# Patient Record
Sex: Female | Born: 2000 | Hispanic: Yes | Marital: Single
Health system: Southern US, Community
[De-identification: ages and names within clinical notes are randomized; demographics above are authoritative.]

---
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
## 2021-05-31 ENCOUNTER — Emergency Department (HOSPITAL_COMMUNITY)
Admission: EM | Admit: 2021-05-31 | Discharge: 2021-05-31 | Disposition: A | Discharge status: Home / Self Care | Payer: BC Managed Care – PPO | Attending: Emergency Medicine | Admitting: Emergency Medicine

## 2021-05-31 ENCOUNTER — Emergency Department (HOSPITAL_COMMUNITY): Payer: BC Managed Care – PPO

## 2021-05-31 ENCOUNTER — Encounter (HOSPITAL_COMMUNITY): Payer: Self-pay | Admitting: Emergency Medicine

## 2021-05-31 DIAGNOSIS — M79645 Pain in left finger(s): Secondary | ICD-10-CM | POA: Diagnosis not present

## 2021-05-31 DIAGNOSIS — Z23 Encounter for immunization: Secondary | ICD-10-CM | POA: Diagnosis not present

## 2021-05-31 DIAGNOSIS — W231XXA Caught, crushed, jammed, or pinched between stationary objects, initial encounter: Secondary | ICD-10-CM | POA: Insufficient documentation

## 2021-05-31 DIAGNOSIS — S61305A Unspecified open wound of left ring finger with damage to nail, initial encounter: Secondary | ICD-10-CM | POA: Insufficient documentation

## 2021-05-31 MED ORDER — TETANUS-DIPHTH-ACELL PERTUSSIS 5-2.5-18.5 LF-MCG/0.5 IM SUSY
0.5000 mL | PREFILLED_SYRINGE | Freq: Once | INTRAMUSCULAR | Status: AC
  Administered 2021-05-31: 0.5 mL via INTRAMUSCULAR
  Filled 2021-05-31: qty 0.5

## 2021-05-31 MED ORDER — ACETAMINOPHEN 325 MG PO TABS
650.0000 mg | ORAL_TABLET | Freq: Once | ORAL | Status: AC
  Administered 2021-05-31: 650 mg via ORAL
  Filled 2021-05-31: qty 2

## 2021-05-31 MED ORDER — OXYCODONE-ACETAMINOPHEN 5-325 MG PO TABS
1.0000 | ORAL_TABLET | Freq: Once | ORAL | Status: AC
  Administered 2021-05-31: 1 via ORAL
  Filled 2021-05-31: qty 1

## 2021-05-31 MED ORDER — CEPHALEXIN 500 MG PO CAPS: 500.0000 mg | ORAL_CAPSULE | Freq: Three times a day (TID) | ORAL | 0 refills | Status: AC

## 2021-05-31 NOTE — Discharge Instructions (Addendum)
Call [facility] on Monday to make an appointment with a hand surgeon.  Please use Tylenol or ibuprofen for pain.  You may use 600 mg ibuprofen every 6 hours or 1000 mg of Tylenol every 6 hours.  You may choose to alternate between the 2.  This would be most effective.  Not to exceed 4 g of Tylenol within 24 hours.  Not to exceed 3200 mg ibuprofen 24 hours.

## 2021-05-31 NOTE — ED Triage Notes (Signed)
Pt closed her L ring and middle fingers in a car door. L ring finger nail is almost completely detached. Reports limited ROM. Icepack applied, jewelry removed.

## 2021-05-31 NOTE — ED Provider Notes (Signed)
[facility]-EMERGENCY DEPT Provider Note   CSN: [id number] Arrival date & time: 05/31/21  0349     History Chief Complaint  Patient presents with   Finger Injury    Kelly Cannon is a 20 y.o. female.  HPI Patient is a 20 year old female presented to the ER today with complaints of left ring finger pain after her acrylic nail extension was caught in a car door and pulled the nail partially off      History reviewed. No pertinent past medical history.  There are no problems to display for this patient.   History reviewed. No pertinent surgical history.   OB History   No obstetric history on file.     History reviewed. No pertinent family history.     Home Medications Prior to Admission medications   Medication Sig Start Date End Date Taking? Authorizing Provider  cephALEXin (KEFLEX) 500 MG capsule Take 1 capsule (500 mg total) by mouth 3 (three) times daily for 5 days. 05/31/21 06/05/21 Yes Gailen Shelter, PA    Allergies    Patient has no known allergies.  Review of Systems   Review of Systems  Constitutional:  Negative for fever.  HENT:  Negative for congestion.   Respiratory:  Negative for shortness of breath.   Cardiovascular:  Negative for chest pain.  Gastrointestinal:  Negative for abdominal distention.  Skin:        Nail avulsion  Neurological:  Negative for dizziness and headaches.   Physical Exam Updated Vital Signs BP (!) 146/90 (BP Location: Right Arm)   Pulse 79   Temp 99.2 F (37.3 C) (Oral)   Resp 18   Ht 5\' 6"  (1.676 m)   Wt 65.8 kg   SpO2 96%   BMI 23.40 kg/m   Physical Exam Vitals and nursing note reviewed.  Constitutional:      General: She is not in acute distress.    Appearance: Normal appearance. She is not ill-appearing.  HENT:     Head: Normocephalic and atraumatic.  Eyes:     General: No scleral icterus.       Right eye: No discharge.        Left eye: No discharge.      Conjunctiva/sclera: Conjunctivae normal.  Pulmonary:     Effort: Pulmonary effort is normal.     Breath sounds: No stridor.  Skin:    General: Skin is warm and dry.     Comments: Cap refill intact in all fingers of left hand.  Left ring finger with avulsed nail it is still connected at the proximal nail plate however has hinged upward with acrylic nail still attached.  Fingertip has good cap refill and sensation  Neurological:     Mental Status: She is alert and oriented to person, place, and time. Mental status is at baseline.    ED Results / Procedures / Treatments   Labs (all labs ordered are listed, but only abnormal results are displayed) Labs Reviewed - No data to display  EKG None  Radiology DG Finger Middle Left  Result Date: 05/31/2021 CLINICAL DATA:  Trauma: Nail injury due to cardial or EXAM: LEFT MIDDLE FINGER 2+V COMPARISON:  None. FINDINGS: There is no evidence of fracture or dislocation. No opaque foreign body. IMPRESSION: Negative for fracture. Electronically Signed   By: 13/06/2021 M.D.   On: 05/31/2021 05:28   DG Finger Ring Left  Result Date: 05/31/2021 CLINICAL DATA:  Ring finger injury. EXAM: LEFT RING  FINGER 2+V COMPARISON:  None. FINDINGS: Up lifted nail. No underlying fracture, opaque foreign body, or subluxation. IMPRESSION: Nail injury without fracture or opaque foreign body. Electronically Signed   By: Tiburcio Pea M.D.   On: 05/31/2021 05:29    Procedures Procedures  Fingernail was shortened with trauma shears tape was used to fix the nail in place and the finger was splinted to provide protection.   Medications Ordered in ED Medications  oxyCODONE-acetaminophen (PERCOCET/ROXICET) 5-325 MG per tablet 1 tablet (1 tablet Oral Given 05/31/21 0617)  acetaminophen (TYLENOL) tablet 650 mg (650 mg Oral Given 05/31/21 0617)  Tdap (BOOSTRIX) injection 0.5 mL (0.5 mLs Intramuscular Given 05/31/21 6387)    ED Course  I have reviewed the triage  vital signs and the nursing notes.  Pertinent labs & imaging results that were available during my care of the patient were reviewed by me and considered in my medical decision making (see chart for details).    MDM Rules/Calculators/A&P                          Patient with nail avulsion this was fixed back in place with tape and Coban and finger splint was applied she will follow-up with hand surgery. Updated on Tdap and given Keflex 3 times daily and discharged home.  Return precautions given.  Otherwise she will follow-up with hand surgery.  Briefly discussed with attending physician. Pt agreable to plan.  I did offer digital block prior to procedure however patient would prefer not to do this.  She tolerated procedure well.  Final Clinical Impression(s) / ED Diagnoses Final diagnoses:  Avulsion of nail of left ring finger    Rx / DC Orders ED Discharge Orders          Ordered    cephALEXin (KEFLEX) 500 MG capsule  3 times daily        05/31/21 0635             Gailen Shelter, PA 05/31/21 0750    Palumbo, April, MD 05/31/21 2327

## 2021-06-02 ENCOUNTER — Emergency Department (HOSPITAL_COMMUNITY)
Admission: EM | Admit: 2021-06-02 | Discharge: 2021-06-02 | Disposition: A | Discharge status: Home / Self Care | Payer: BC Managed Care – PPO | Attending: Emergency Medicine | Admitting: Emergency Medicine

## 2021-06-02 ENCOUNTER — Other Ambulatory Visit: Payer: Self-pay

## 2021-06-02 ENCOUNTER — Encounter (HOSPITAL_COMMUNITY): Payer: Self-pay

## 2021-06-02 DIAGNOSIS — S6992XD Unspecified injury of left wrist, hand and finger(s), subsequent encounter: Secondary | ICD-10-CM

## 2021-06-02 DIAGNOSIS — W230XXA Caught, crushed, jammed, or pinched between moving objects, initial encounter: Secondary | ICD-10-CM | POA: Insufficient documentation

## 2021-06-02 DIAGNOSIS — S6992XA Unspecified injury of left wrist, hand and finger(s), initial encounter: Secondary | ICD-10-CM | POA: Insufficient documentation

## 2021-06-02 NOTE — ED Triage Notes (Signed)
Pt c/o injury to left ring finger that is bandaged up. Pt states that the provider she saw here in the ED Saturday morning told her to come back today. Pt states the nail of her left ring finger is detached.

## 2021-06-02 NOTE — ED Provider Notes (Signed)
[facility]-EMERGENCY DEPT Provider Note   CSN: [id number] Arrival date & time: 06/02/21  1430     History Chief Complaint  Patient presents with   Finger Injury    Kelly Cannon is a 20 y.o. female.  20 year old female presents today for evaluation of follow-up on her finger injury.  She reports no change since being evaluated last ER visit.  She reports that she had her left ring finger stuck in a door when she slammed a door shut and I removed her fingernail.  She had this placed back in place plan to last visit and was instructed to follow-up with hand surgery.  She denies any worsening in pain, difficulty moving her finger, or fever.  The history is provided by the patient. No language interpreter was used.      History reviewed. No pertinent past medical history.  There are no problems to display for this patient.   History reviewed. No pertinent surgical history.   OB History   No obstetric history on file.     History reviewed. No pertinent family history.     Home Medications Prior to Admission medications   Medication Sig Start Date End Date Taking? Authorizing Provider  cephALEXin (KEFLEX) 500 MG capsule Take 1 capsule (500 mg total) by mouth 3 (three) times daily for 5 days. 05/31/21 06/05/21  Gailen Shelter, PA    Allergies    Patient has no known allergies.  Review of Systems   Review of Systems  Constitutional:  Negative for chills and fever.  Musculoskeletal:  Negative for arthralgias and joint swelling.  Skin:  Positive for wound.   Physical Exam Updated Vital Signs BP 116/79 (BP Location: Left Arm)   Pulse 64   Temp 98.1 F (36.7 C) (Oral)   Resp 16   Ht 5\' 6"  (1.676 m)   Wt 76.2 kg   SpO2 97%   BMI 27.12 kg/m   Physical Exam Vitals and nursing note reviewed.  Constitutional:      General: She is not in acute distress.    Appearance: Normal appearance. She is not ill-appearing.  HENT:     Head:  Normocephalic and atraumatic.     Nose: Nose normal.  Eyes:     Conjunctiva/sclera: Conjunctivae normal.  Pulmonary:     Effort: Pulmonary effort is normal. No respiratory distress.  Musculoskeletal:        General: No deformity.     Comments: Patient with splint and Coban in place on left ring finger.  Patient with good range of motion in all of her digits including left ring finger.  Patient with 2+ radial and ulnar pulse.  Patient with good sensation in her hand.  Skin:    Findings: No rash.  Neurological:     Mental Status: She is alert.    ED Results / Procedures / Treatments   Labs (all labs ordered are listed, but only abnormal results are displayed) Labs Reviewed - No data to display  EKG None  Radiology No results found.  Procedures Procedures   Medications Ordered in ED Medications - No data to display  ED Course  I have reviewed the triage vital signs and the nursing notes.  Pertinent labs & imaging results that were available during my care of the patient were reviewed by me and considered in my medical decision making (see chart for details).    MDM Rules/Calculators/A&P  Patient presents today for evaluation of left ring finger.  Patient believes he was told to follow-up in the emergency room.  Per chart review patient was instructed to follow-up with hand surgery on last she had worsening or acute changes.  She denies any acute change.  We will provide patient with hand surgery follow-up information for follow-up.  She voices understanding and is in agreement with plan.  Return precautions to the emergency room discussed.  Final Clinical Impression(s) / ED Diagnoses Final diagnoses:  None    Rx / DC Orders ED Discharge Orders     None        Marita [place], PA-C 06/02/21 1549    Cathren Laine, MD 06/02/21 [phone]

## 2021-06-02 NOTE — Discharge Instructions (Addendum)
Your exam was reassuring.  And consistent with your last ER visit.  Continue taking ibuprofen for pain.  Call the Ortho clinic listed above to schedule a follow-up appointment.  Return to the emergency room if you have worsening swelling or or ability to use the finger.  Complete your antibiotic course.

## 2022-06-18 IMAGING — DX DG FINGER RING 2+V*L*
3 series · 3 of 3 positions shown · non-contrast
Comparison: None.

CLINICAL DATA: Ring finger injury.

EXAM:
LEFT RING FINGER 2+V

[finger ap]
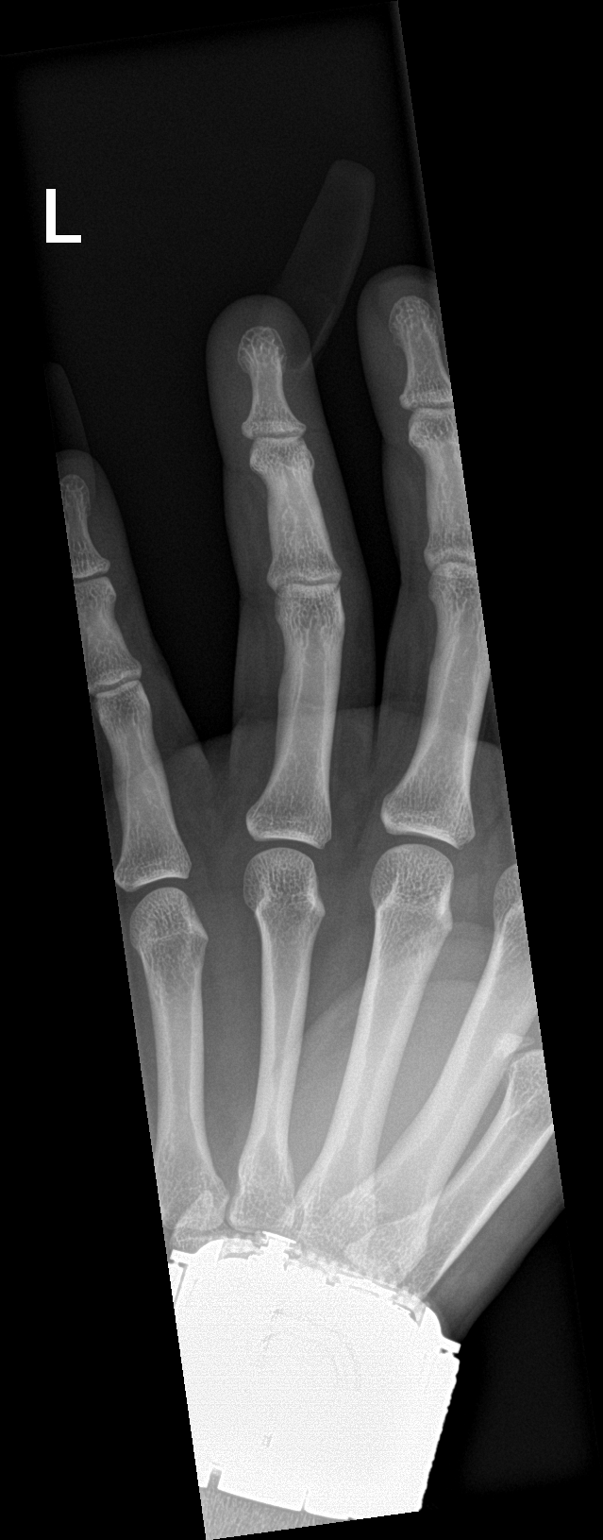

[finger obl]
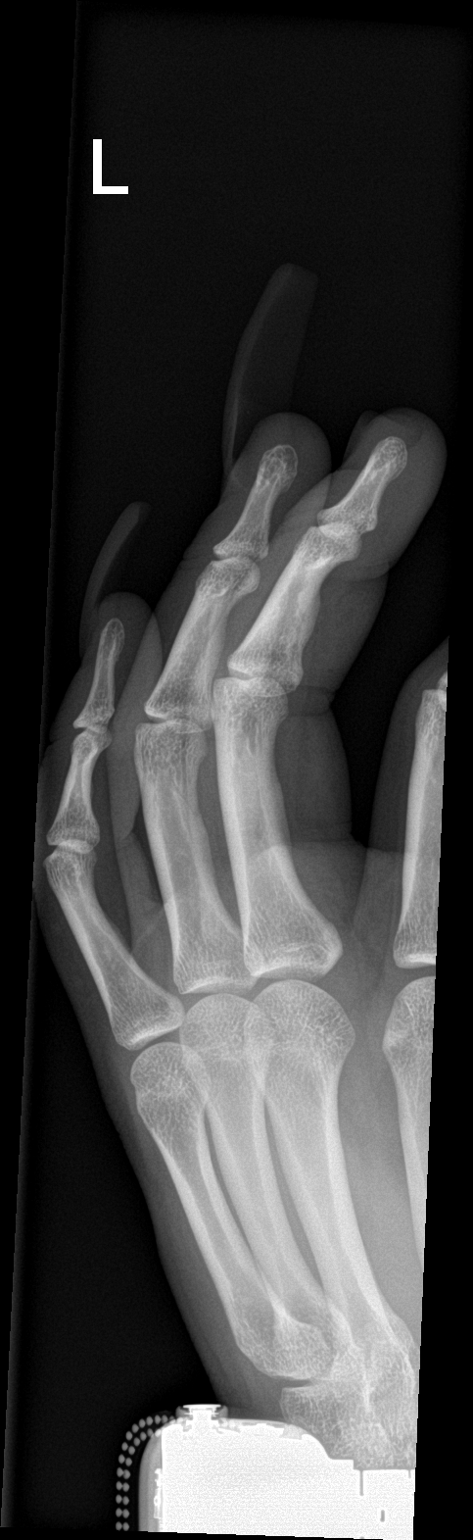

[finger lat]
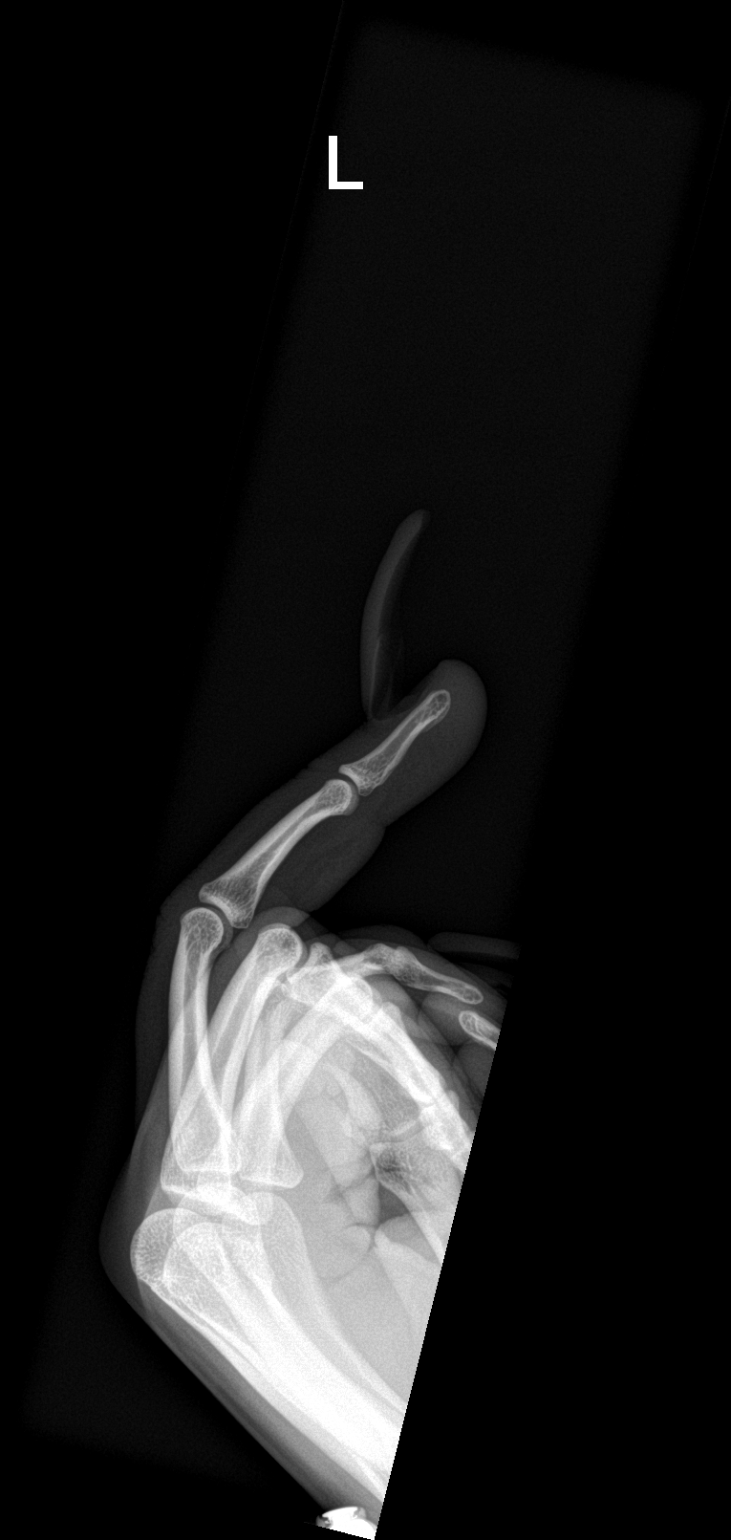

[3 of 3 positions shown; findings below may reference images not displayed]

FINDINGS: Up lifted nail. No underlying fracture, opaque foreign body, or
subluxation.
IMPRESSION: Nail injury without fracture or opaque foreign body.
